# Patient Record
Sex: Female | Born: 1990 | Race: White | Marital: Single | Smoking: Never smoker
Health system: Southern US, Community
[De-identification: ages and names within clinical notes are randomized; demographics above are authoritative.]

## PROBLEM LIST (undated history)

## (undated) DIAGNOSIS — G43909 Migraine, unspecified, not intractable, without status migrainosus: Secondary | ICD-10-CM

## (undated) HISTORY — DX: Migraine, unspecified, not intractable, without status migrainosus: G43.909

---
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
## 2012-07-27 ENCOUNTER — Other Ambulatory Visit: Payer: Self-pay

## 2012-07-27 MED ORDER — TOPIRAMATE 25 MG PO TABS: 75.0000 mg | ORAL_TABLET | Freq: Every day | ORAL | Status: DC

## 2012-07-27 MED ORDER — TOPIRAMATE 50 MG PO TABS: 50.0000 mg | ORAL_TABLET | Freq: Two times a day (BID) | ORAL | Status: DC

## 2012-07-27 NOTE — Telephone Encounter (Signed)
Mom called and said she would like a new Rx sent to the pharmacy for Topamax 25mg  three daily.  She said the dose was changed.  I looked back in centricity.  Although the med list reflected 50mg  BID, and the pharmacy requested 50mg  BID, there was a phone note that said:  Follow-up Details: Left message to cut back to 75mg  total dose daily, per Eber Jones. Follow-up by: Mickey Farber,  December 17, 2011 8:55 AM It appears this was not updated on the med list.  I apologized to the mother for the confusion and ensured her we will resend the Rx for 75mg  total daily.  She will follow up with the pharmacy and call us back if needed.   I called the pharmacy.  Spoke with Shanda Bumps.  She verified they did get the Rx for 25mg  three daily and have cancelled the 50mg  BID Rx.  Says if they need any further information, they will contact us.

## 2012-07-27 NOTE — Telephone Encounter (Signed)
Patient was on 25 mg tabs as a titration dose.  Maintenance dose is 50 mg bid.

## 2012-08-17 ENCOUNTER — Ambulatory Visit (INDEPENDENT_AMBULATORY_CARE_PROVIDER_SITE_OTHER): Payer: BC Managed Care – PPO | Admitting: Neurology

## 2012-08-17 ENCOUNTER — Encounter: Payer: Self-pay | Admitting: Neurology

## 2012-08-17 VITALS — BP 114/72 | HR 70 | Resp 13 | Ht 63.5 in | Wt 108.0 lb

## 2012-08-17 DIAGNOSIS — G43909 Migraine, unspecified, not intractable, without status migrainosus: Secondary | ICD-10-CM | POA: Insufficient documentation

## 2012-08-17 HISTORY — DX: Migraine, unspecified, not intractable, without status migrainosus: G43.909

## 2012-08-17 MED ORDER — TOPIRAMATE 25 MG PO TABS: 50.0000 mg | ORAL_TABLET | Freq: Three times a day (TID) | ORAL | Status: DC

## 2012-08-17 MED ORDER — ZOLMITRIPTAN 5 MG NA SOLN: 2.5000 mg | NASAL | Status: AC | PRN

## 2012-08-17 NOTE — Patient Instructions (Signed)
Migraine Headache A migraine headache is very bad, throbbing pain on one or both sides of your head. Talk to your doctor about what things may bring on (trigger) your migraine headaches. HOME CARE  Only take medicines as told by your doctor.  Lie down in a dark, quiet room when you have a migraine.  Keep a journal to find out if certain things bring on migraine headaches. For example, write down:  What you eat and drink.  How much sleep you get.  Any change to your diet or medicines.  Lessen how much alcohol you drink.  Quit smoking if you smoke.  Get enough sleep.  Lessen any stress in your life.  Keep lights dim if bright lights bother you or make your migraines worse. GET HELP RIGHT AWAY IF:   Your migraine becomes really bad.  You have a fever.  You have a stiff neck.  You have trouble seeing.  Your muscles are weak, or you lose muscle control.  You lose your balance or have trouble walking.  You feel like you will pass out (faint), or you pass out.  You have really bad symptoms that are different than your first symptoms. MAKE SURE YOU:   Understand these instructions.  Will watch your condition.  Will get help right away if you are not doing well or get worse. Document Released: 11/05/2007 Document Revised: 04/20/2011 Document Reviewed: 01/16/2011 Exit[employer] Patient Information 2014 [employer].  

## 2012-08-17 NOTE — Progress Notes (Signed)
[facility]  Provider:  Dr Vickey Huger Referring Provider: No ref. provider found Primary Care Physician:  Astrid Divine, MD  Chief Complaint  Patient presents with  . Follow-up    migraines, rm 10    HPI:  Elizabeth Dickson is a 22 y.o. female here as a  Revisit for Migraine follow up- originally referral from Dr. Valentina Lucks.    This Caucasian right-handed female patient was originally referred to [place] in January 2013 by Dr. Wynetta Emery after a normal head CT - . She had any recurring headaches since 2012.  but said prevalence in daytime headaches used to come towards the late afternoon she Headache diarrhea and felt no correlation performed intake or sleep actually the patient confirmed that she slept rather well about 7 or 8 hours nightly. Initially the she felt that Aleve,  Excedrin and Naprosyn were helping- but the character of her headache changed and became more and more migrainous.  It also started to bother her earlier in the day. She describes frontal headaches arising retro-orbital not exacerbated by physical activity or strain if she would bend forward she also felt that the headaches get worse. Associated with the headaches were nausea, photophobia but not phonophobia. The patient started in January  with a  preventive medications and was titrated to topiramate- on which  she had good headache and migraine control ,but noticed on 100 mg daily some word finding and retrieval difficulties.  She is now taking 75 mg TPM  daily and feels mentally and cogitively less impaired.    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 .   Review of Systems: Out of a complete 14 system review, the patient complains of only the following symptoms, and all other reviewed systems are negative.  word finding, cognitive delay. No headaches .   History   Social History  . Marital Status: Single    Spouse Name: N/A   Number of Children: N/A  . Years of Education: college   Occupational History  .      college student   Social History Main Topics  . Smoking status: Never Smoker   . Smokeless tobacco: Not on file  . Alcohol Use: Yes     Comment: occas.  . Drug Use: No  . Sexually Active: Not on file   Other Topics Concern  . Not on file   Social History Narrative  . No narrative on file    Family History  Problem Relation Age of Onset  . Migraines Sister   . Dementia Paternal Grandmother   . Migraines Paternal Grandmother     Past Medical History  Diagnosis Date  . Migraines   . Migraine, unspecified, without mention of intractable migraine without mention of status migrainosus 08/17/2012     Controlled on topiramate 75 mg, Relpax for acute pain.     Past Surgical History  Procedure Laterality Date  . Wisdom tooth extraction  2009    Current Outpatient Prescriptions  Medication Sig Dispense Refill  . eletriptan (RELPAX) 40 MG tablet Take 40 mg by mouth as needed for migraine. One tablet by mouth at onset of headache. May repeat in 2 hours if headache persists or recurs.      . Etonogestrel-Ethinyl Estradiol (NUVARING VA) Place vaginally daily.      Marland Kitchen topiramate (TOPAMAX) 25 MG tablet Take 75 mg by mouth 3 (three) times daily. As directed  No current facility-administered medications for this visit.    Allergies as of 08/17/2012  . (No Known Allergies)    Vitals: BP 114/72  Pulse 70  Resp 13  Ht 5' 3.5" (1.613 m)  Wt 108 lb (48.988 kg)  BMI 18.83 kg/m2 Last Weight:  Wt Readings from Last 1 Encounters:  08/17/12 108 lb (48.988 kg)   Last Height:   Ht Readings from Last 1 Encounters:  08/17/12 5' 3.5" (1.613 m)     Physical exam:  General: The patient is awake, alert and appears not in acute distress. The patient is well groomed. Head: Normocephalic, atraumatic. Neck is supple. Mallampati 1  neck circumference: 13.5  Cardiovascular:  Regular rate and  rhythm, without  murmurs or carotid bruit, and without distended neck veins. Respiratory: Lungs are clear to auscultation. Skin:  Without evidence of edema, or rash Trunk: BMI normal.  Neurologic exam : The patient is awake and alert, oriented to place and time.  Memory subjective  described as intact. There is a normal attention span & concentration ability. Speech is fluent without  dysarthria, dysphonia or aphasia. Mood and affect are appropriate.  Cranial nerves: Pupils are equal and briskly reactive to light. Funduscopic exam without evidence of pallor or edema. Extraocular movements  in vertical and horizontal planes intact and without nystagmus. Visual fields by finger perimetry are intact. Hearing to finger rub intact.  Facial sensation intact to fine touch. Facial motor strength is symmetric and tongue and uvula move midline.  Motor exam: Normal tone and normal muscle bulk and symmetric normal strength in all extremities.  Sensory:  Fine touch, pinprick and vibration were tested in all extremities. Proprioception is  normal.  Coordination: Rapid alternating movements in the fingers/hands is tested and normal. Finger-to-nose maneuver without evidence of ataxia, dysmetria or tremor.  Gait and station: Patient walks without assistive device and is able and assisted stool climb up to the exam table. Strength within normal limits.  Deep tendon reflexes: in the  upper and lower extremities are symmetric and intact. Babinski maneuver response is  Downgoing.    The patient will continue with the 75 mg pills of topiramate for migraine prophylaxis. She is using and also ring for hormonal contraception, the effectiveness of this method of birth control should not be limited by topiramate the low 100 mg daily dosing. I also explained to the patient that she is not at a higher risk for stroke based on her age, body mass index, nonsmoking status and since her migraines are not a hemiplegic  migraines or complicated neurologic migraines such as related   To vertebrobasilar vasal spasms.  Over this the patient today to a different medications and sample from 1 is scheduled for now in a gel capsule at 25 mg to be used for acute onset headaches that may not respond to a trip down. The patient recently noted that Relpax seems to work less than it initially did therefore I gave her a sore make a nasal spray but she can first try.  It should have a noticeable onset time below 30 minutes, if she does not feel relief within 45 minutes after taking the nasal spray she is free to take Zipsor.   Assessment:  After physical and neurologic examination, review of laboratory studies, imaging, neurophysiology testing and pre-existing records, assessment will be reviewed on the problem list.  Plan:  Treatment plan  under Problem List

## 2013-01-23 ENCOUNTER — Telehealth: Payer: Self-pay | Admitting: Neurology

## 2013-01-23 DIAGNOSIS — G43909 Migraine, unspecified, not intractable, without status migrainosus: Secondary | ICD-10-CM

## 2013-01-23 MED ORDER — TOPIRAMATE 25 MG PO TABS: 25.0000 mg | ORAL_TABLET | Freq: Two times a day (BID) | ORAL | Status: AC

## 2013-01-23 NOTE — Telephone Encounter (Signed)
Please advise 

## 2013-01-23 NOTE — Telephone Encounter (Signed)
I called and spoke with the patient.  She said she is not, and has never taken Topamax TID.  Says she currently takes only 25mg  BID, not 50mg  TID as the message said she requested.  As well, says she never asked for mail in Rx.  She would like a refill sent to [facility] for 25mg  BID.  Last OV says she tales 25mg  TID, but patient states she was not able to take that dose.

## 2013-01-23 NOTE — Telephone Encounter (Signed)
Rx faxed

## 2013-04-28 IMAGING — CT CT HEAD W/O CM
2 series · 16 of 30 positions shown, 20 images · non-contrast
Comparison: None.

CLINICAL DATA: Headache for several months, occasional vertigo

CT HEAD WITHOUT CONTRAST
TECHNIQUE: Contiguous axial images were obtained from the base of
the skull through the vertex without contrast.

[Series 2: head w/o · axial · non-contrast · 0.49mm/px · z∈[-7,+113]mm · 13 of 28 slices shown, 17 images]
[im 2/28  brain]
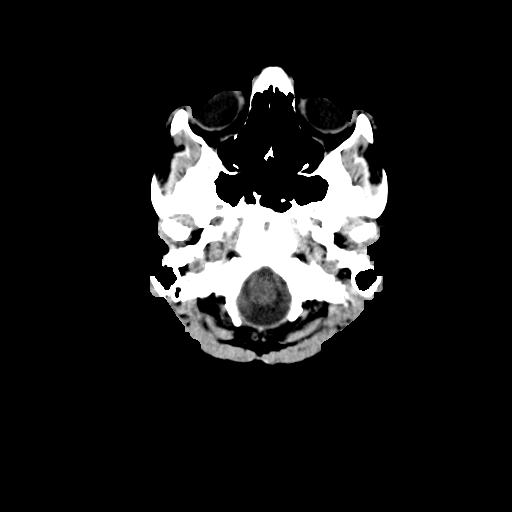
[im 2/28  bone]
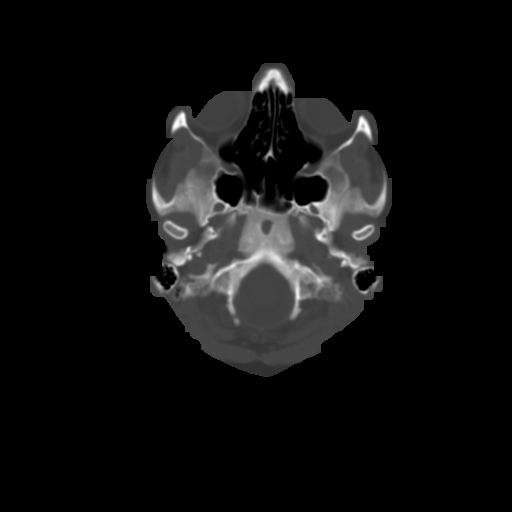
[im 4/28  brain]
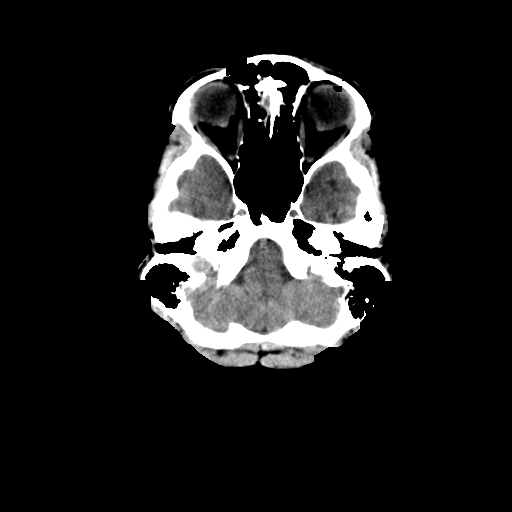
[im 6/28  brain]
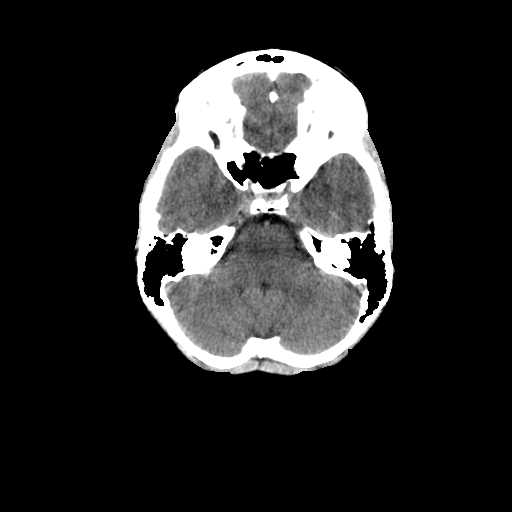
[im 8/28  brain]
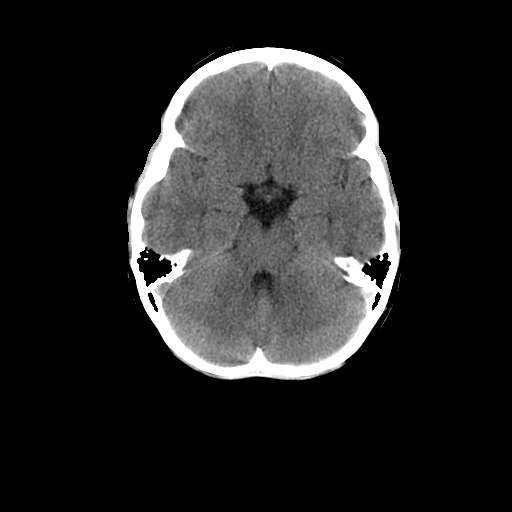
[im 10/28  brain]
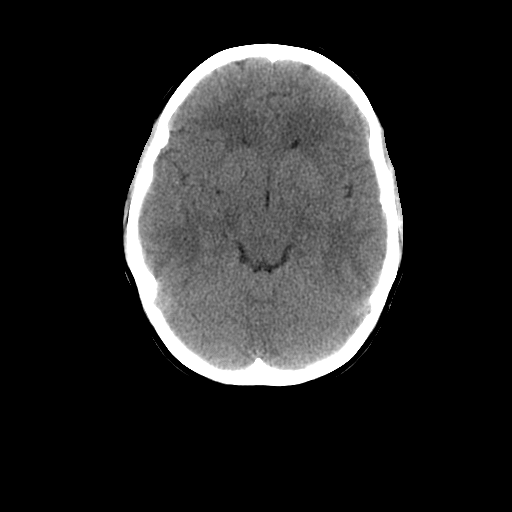
[im 10/28  bone]
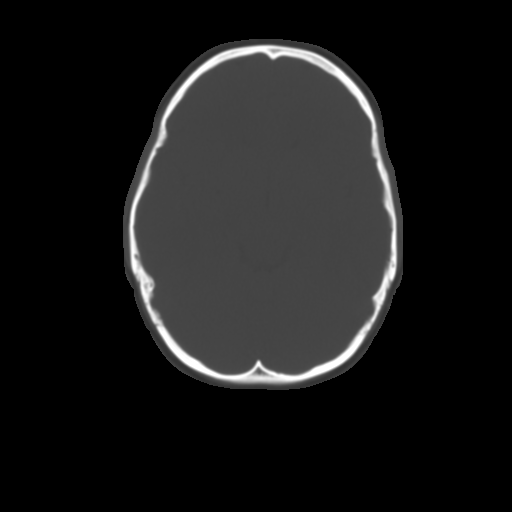
[im 12/28  brain]
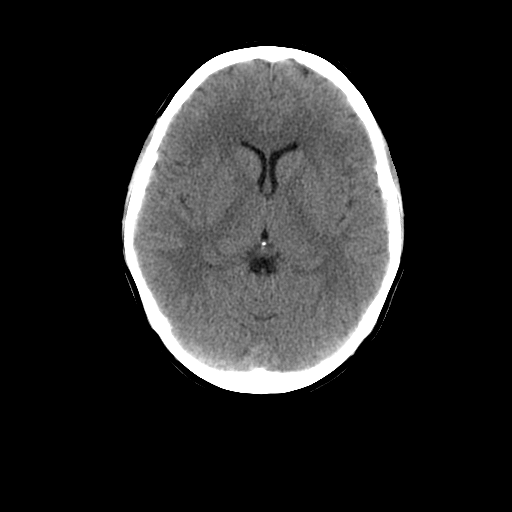
[im 14/28  brain]
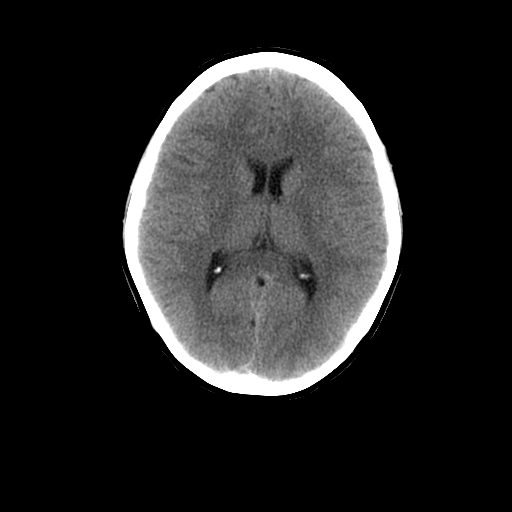
[im 16/28  brain]
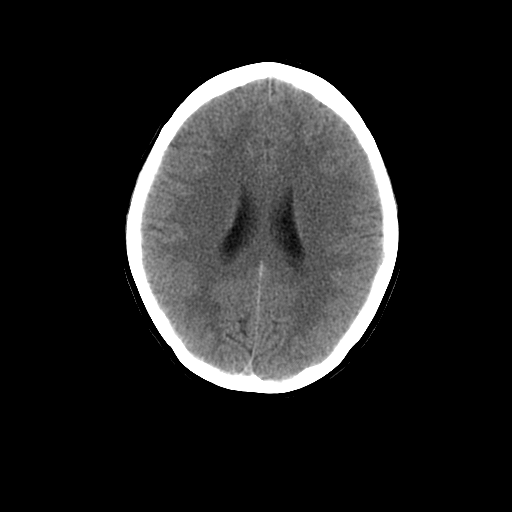
[im 18/28  brain]
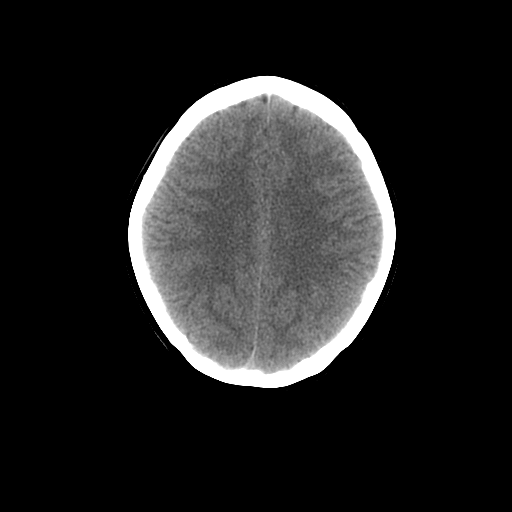
[im 18/28  bone]
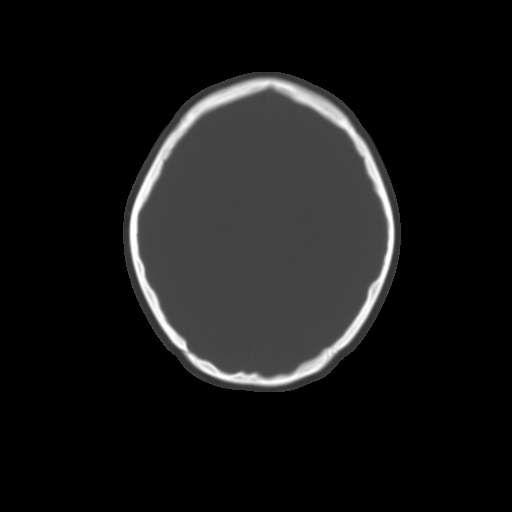
[im 20/28  brain]
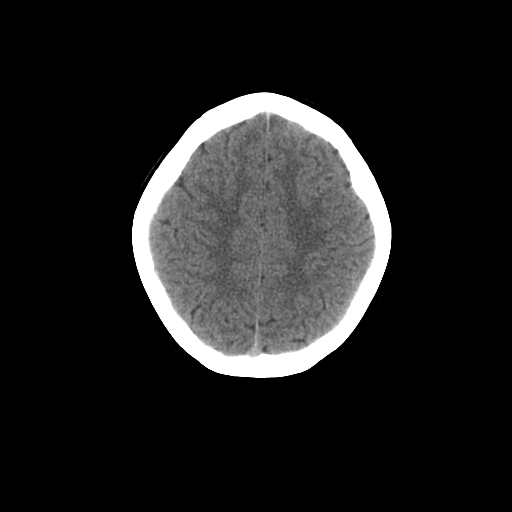
[im 22/28  brain]
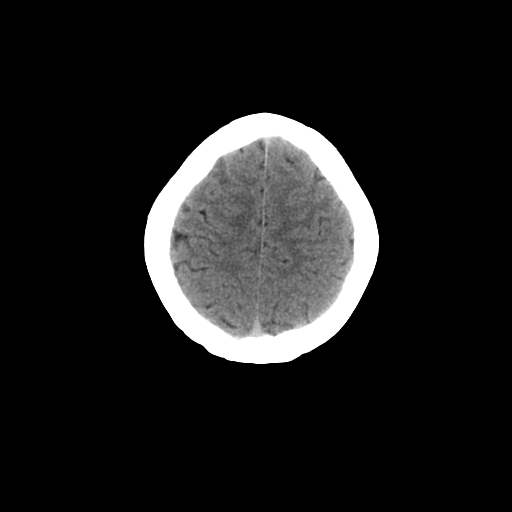
[im 24/28  brain]
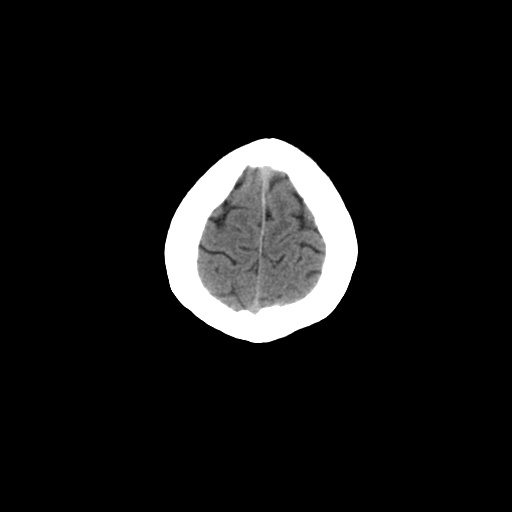
[im 26/28  brain]
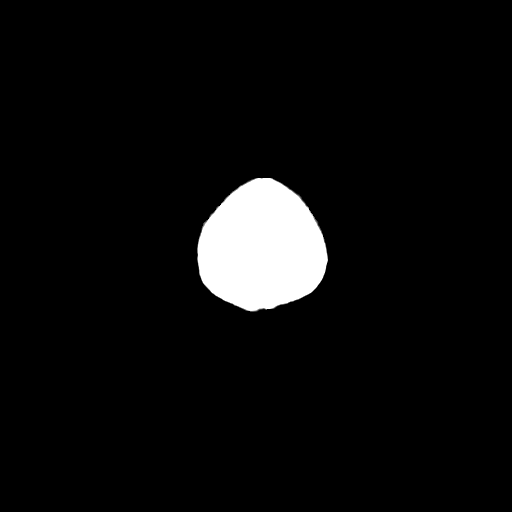
[im 26/28  bone]
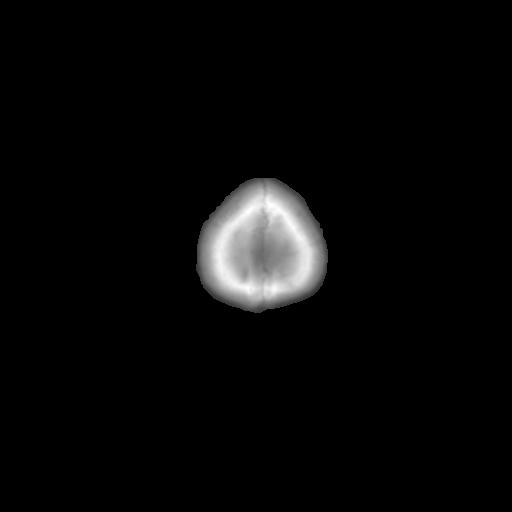

[Series 3: head bone · axial · 0.49mm/px · z∈[-7,+33]mm · 3 of 28 slices shown]
[im 2/28  bone]
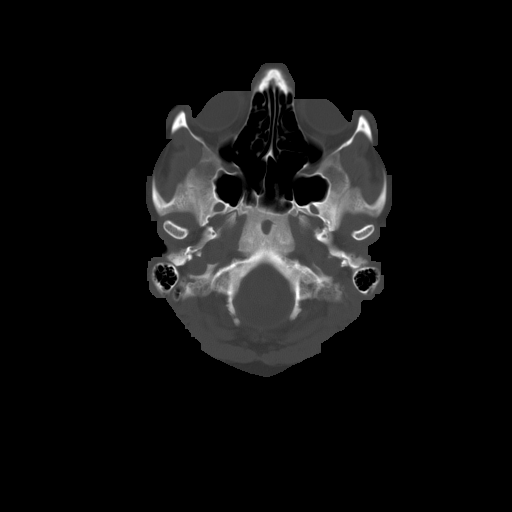
[im 6/28  bone]
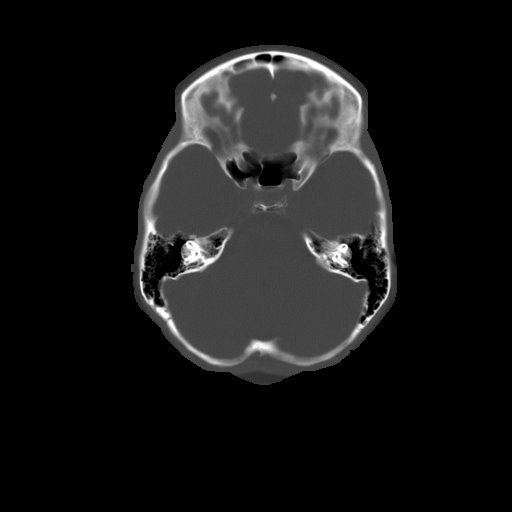
[im 10/28  bone]
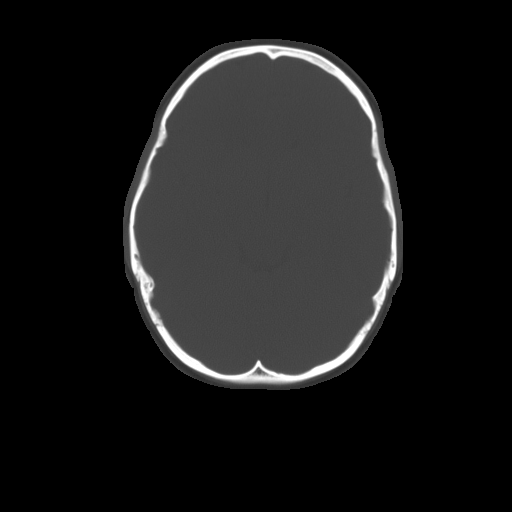

[16 of 30 positions shown; findings below may reference images not displayed]

FINDINGS: The ventricular system is normal in size and
configuration, and the septum is in a normal midline position.  The
fourth ventricle and basilar cisterns are normal.  No hemorrhage,
mass lesion, or acute infarction is seen.  On bone window images,
no calvarial abnormality is noted.  The paranasal sinuses are well
pneumatized.
IMPRESSION: Negative unenhanced CT of the brain.

## 2013-10-17 ENCOUNTER — Telehealth: Payer: Self-pay | Admitting: Neurology

## 2013-10-17 NOTE — Telephone Encounter (Signed)
Patient has a question about Topamax. She stopped taking Topamax for a while and wants to restart it but wants to know the protocol for that. Best number to contact her  is [phone] and it is okay to leave a message.

## 2013-10-17 NOTE — Telephone Encounter (Signed)
Patient did wean off of the Topamax 50 mg and then to 25 mg  in January  But feels now with school she is getting headaches again and wants to start back taking and wanted to know what dose to start back on.  Patient states that the last time she was taking 50 mg  Bid, splitting the 100 mg and taking half.  Please advise and leave a detailed message on cell if no answer.

## 2013-10-17 NOTE — Telephone Encounter (Signed)
Spoke to patient, she will use 25 mg 2 tabs at night po, has still medication in the home. Will call hen refills are needed.

## 2013-10-17 NOTE — Telephone Encounter (Signed)
Left a vm to call the office back with her questions concerning the Topamax.

## 2014-06-26 ENCOUNTER — Other Ambulatory Visit: Payer: Self-pay | Admitting: Family Medicine

## 2014-06-26 ENCOUNTER — Other Ambulatory Visit (HOSPITAL_COMMUNITY)
Admission: RE | Admit: 2014-06-26 | Discharge: 2014-06-26 | Disposition: A | Discharge status: Home / Self Care | Payer: BLUE CROSS/BLUE SHIELD | Source: Ambulatory Visit | Attending: Family Medicine | Admitting: Family Medicine

## 2014-06-26 DIAGNOSIS — Z124 Encounter for screening for malignant neoplasm of cervix: Secondary | ICD-10-CM | POA: Diagnosis not present

## 2014-06-27 LAB — CYTOLOGY - PAP

## 2017-06-24 ENCOUNTER — Other Ambulatory Visit: Payer: Self-pay | Admitting: Family Medicine

## 2017-06-24 ENCOUNTER — Other Ambulatory Visit (HOSPITAL_COMMUNITY)
Admission: RE | Admit: 2017-06-24 | Discharge: 2017-06-24 | Disposition: A | Discharge status: Home / Self Care | Payer: BLUE CROSS/BLUE SHIELD | Source: Ambulatory Visit | Attending: Family Medicine | Admitting: Family Medicine

## 2017-06-24 DIAGNOSIS — Z01419 Encounter for gynecological examination (general) (routine) without abnormal findings: Secondary | ICD-10-CM | POA: Diagnosis present

## 2017-06-28 LAB — CYTOLOGY - PAP: DIAGNOSIS: NEGATIVE

## 2019-01-09 ENCOUNTER — Other Ambulatory Visit: Payer: Self-pay

## 2019-01-09 DIAGNOSIS — Z20822 Contact with and (suspected) exposure to covid-19: Secondary | ICD-10-CM

## 2019-01-10 LAB — NOVEL CORONAVIRUS, NAA: SARS-CoV-2, NAA: NOT DETECTED

## 2019-01-30 ENCOUNTER — Other Ambulatory Visit: Payer: BLUE CROSS/BLUE SHIELD

## 2019-02-01 ENCOUNTER — Ambulatory Visit: Payer: No Typology Code available for payment source | Attending: Internal Medicine

## 2019-02-01 ENCOUNTER — Other Ambulatory Visit: Payer: BLUE CROSS/BLUE SHIELD

## 2019-02-01 DIAGNOSIS — Z20822 Contact with and (suspected) exposure to covid-19: Secondary | ICD-10-CM

## 2019-02-03 LAB — NOVEL CORONAVIRUS, NAA: SARS-CoV-2, NAA: NOT DETECTED

## 2019-02-23 ENCOUNTER — Ambulatory Visit: Payer: No Typology Code available for payment source | Attending: Internal Medicine

## 2019-02-23 DIAGNOSIS — Z20822 Contact with and (suspected) exposure to covid-19: Secondary | ICD-10-CM

## 2019-02-24 LAB — NOVEL CORONAVIRUS, NAA: SARS-CoV-2, NAA: NOT DETECTED

## 2019-03-07 ENCOUNTER — Other Ambulatory Visit: Payer: No Typology Code available for payment source

## 2019-03-08 ENCOUNTER — Ambulatory Visit: Payer: No Typology Code available for payment source | Attending: Internal Medicine

## 2019-03-08 DIAGNOSIS — Z20822 Contact with and (suspected) exposure to covid-19: Secondary | ICD-10-CM

## 2019-03-09 LAB — NOVEL CORONAVIRUS, NAA: SARS-CoV-2, NAA: NOT DETECTED
